# Patient Record
Sex: Male | Born: 2000 | Race: Black or African American | Hispanic: No | Marital: Single | Smoking: Never smoker
Health system: Southern US, Community
[De-identification: ages and names within clinical notes are randomized; demographics above are authoritative.]

---
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
## 2018-01-21 ENCOUNTER — Ambulatory Visit: Payer: BLUE CROSS/BLUE SHIELD | Admitting: Physician Assistant

## 2019-02-08 ENCOUNTER — Ambulatory Visit (INDEPENDENT_AMBULATORY_CARE_PROVIDER_SITE_OTHER): Payer: BC Managed Care – PPO | Admitting: Family Medicine

## 2019-02-08 ENCOUNTER — Other Ambulatory Visit: Payer: Self-pay

## 2019-02-08 ENCOUNTER — Encounter: Payer: Self-pay | Admitting: Family Medicine

## 2019-02-08 VITALS — BP 140/80 | HR 96 | Temp 98.0°F | Resp 16 | Ht 77.0 in | Wt >= 6400 oz

## 2019-02-08 DIAGNOSIS — Z0000 Encounter for general adult medical examination without abnormal findings: Secondary | ICD-10-CM

## 2019-02-08 DIAGNOSIS — Z6841 Body Mass Index (BMI) 40.0 and over, adult: Secondary | ICD-10-CM

## 2019-02-08 NOTE — Progress Notes (Signed)
Subjective:    Patient ID: Jacob Harrington, male    DOB: 02/06/2001, 18 y.o.   MRN: [id number]  HPI Jacob Harrington is a 18 y.o. male Presents today for: Chief Complaint  Patient presents with   Annual Exam    patient not haveing any issus or complaints at this time   In for physical exam.  Was last seen June 2019 by Dr. Pamella Pert for school physical.  At that time was going to school at [employer].  Planned football, playing high school.  Denied prior concussion or injury.   Prior HS - [employer], played [employer] in HS.  Planned walk on last year, but would not play? Considering playing this year, but unknown return plans. In person 1st semester, then virtual?   No known family history of sudden cardiac death or deaths under age 31.   No meds.  No surgeries.   Sexually active - 3 lifetime partners. No hx of STI, no prior testing. Wears condoms every time.  Declines STI testing.    Obesity: Fast food - 2 days per week,  No soda/sweet tea. No alcohol.  Fried food - 2 days per week.  No regular exercise.  Inconsistent breakfast intake.  No new hot or cold intolerance. No new hair or skin changes, heart palpitations or new fatigue.    Screening lipids, glucose recommended - declined blood work today.   Body mass index is 51.92 kg/m.  Wt Readings from Last 3 Encounters:  02/08/19 (!) 437 lb 12.8 oz (198.6 kg)  12/14/17 (!) 420 lb 9.6 oz (190.8 kg) (>99 %, Z= 3.93)*  09/20/17 (!) 407 lb (184.6 kg) (>99 %, Z= 3.83)*   * Growth percentiles are based on CDC (Boys, 2-20 Years) data.     Depression screen [facility] 2/9 02/08/2019 12/14/2017 01/22/2016 03/11/2015  Decreased Interest 0 0 0 0  Down, Depressed, Hopeless 0 0 0 0  PHQ - 2 Score 0 0 0 0  Altered sleeping - - 0 -  Tired, decreased energy - - 0 -  Change in appetite - - 0 -  Feeling bad or failure about yourself  - - 0 -  Trouble concentrating - - 0 -  Moving slowly or fidgety/restless - - 0 -  Suicidal thoughts  - - 0 -  PHQ-9 Score - - 0 -    Immunization History  Administered Date(s) Administered   DTaP 03/05/1999, 05/05/1999, 07/09/1999, 04/13/2000, 01/04/2004   Hepatitis A 03/24/2012, 02/07/2014   Hepatitis B [birth year]Jul-1998, 01/29/1999, 07/09/1999   HiB (PRP-OMP) 03/05/1999, 05/05/1999, 07/09/1999, 04/13/2000   IPV 03/05/1999, 05/05/1999, 01/12/2000, 01/04/2004   Influenza-Unspecified 02/24/2008, 03/24/2012   MMR 01/12/2000, 01/04/2004   Meningococcal Conjugate 01/02/2010   Meningococcal Polysaccharide 01/24/2015   Pneumococcal Conjugate-13 11/20/1999, 01/12/2000   Pneumococcal-Unspecified 10/13/1999   Td 01/02/2010   Tdap 01/02/2010   Varicella 01/12/2000, 02/07/2014  not sure about HPV vaccine - would like to read about it first.   No exam data present   Dentist- every 6 months.    No alcohol/tobacco.    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 No past surgical history on file. No Known Allergies Prior to Admission medications   Not on File   Social History   Socioeconomic History   Marital status: Single    Spouse name: Not on file   Number of children: Not on file   Years  of education: Not on file   Highest education level: Not on file  Occupational History   Not on file  Social Needs   Financial resource strain: Not on file   Food insecurity    Worry: Not on file    Inability: Not on file   Transportation needs    Medical: Not on file    Non-medical: Not on file  Tobacco Use   Smoking status: Never Smoker   Smokeless tobacco: Never Used  Substance and Sexual Activity   Alcohol use: No   Drug use: No   Sexual activity: Yes  Lifestyle   Physical activity    Days per week: Not on file    Minutes per session: Not on file   Stress: Not on file  Relationships   Social connections    Talks on phone: Not on file    Gets together: Not on file     Attends religious service: Not on file    Active member of club or organization: Not on file    Attends meetings of clubs or organizations: Not on file    Relationship status: Not on file   Intimate partner violence    Fear of current or ex partner: Not on file    Emotionally abused: Not on file    Physically abused: Not on file    Forced sexual activity: Not on file  Other Topics Concern   Not on file  Social History Narrative   Not on file    Review of Systems 13 point review of systems per patient health survey noted.  Negative other than as indicated above or in HPI.      Objective:   Physical Exam Constitutional:      Appearance: He is well-developed. He is obese.  HENT:     Head: Normocephalic and atraumatic.     Right Ear: External ear normal.     Left Ear: External ear normal.  Eyes:     Conjunctiva/sclera: Conjunctivae normal.     Pupils: Pupils are equal, round, and reactive to light.  Neck:     Musculoskeletal: Normal range of motion and neck supple.     Thyroid: No thyromegaly.  Cardiovascular:     Rate and Rhythm: Normal rate and regular rhythm.     Heart sounds: Normal heart sounds.  Pulmonary:     Effort: Pulmonary effort is normal. No respiratory distress.     Breath sounds: Normal breath sounds. No wheezing.  Abdominal:     General: There is no distension.     Palpations: Abdomen is soft.     Tenderness: There is no abdominal tenderness.  Musculoskeletal: Normal range of motion.        General: No tenderness.     Right shoulder: Normal.     Left shoulder: Normal.     Right elbow: Normal.    Left elbow: Normal.     Right wrist: Normal.     Left wrist: Normal.     Right hip: Normal.     Left hip: Normal.     Right knee: Normal.     Left knee: Normal.     Right ankle: Normal.     Left ankle: Normal.     Lumbar back: Normal.  Lymphadenopathy:     Cervical: No cervical adenopathy.  Skin:    General: Skin is warm and dry.  Neurological:      Mental Status: He is alert and oriented to person, place, and  time.  Psychiatric:        Behavior: Behavior normal.    Vitals:   02/08/19 1505  BP: 140/80  Pulse: 96  Resp: 16  Temp: 98 F (36.7 C)  TempSrc: Oral  SpO2: 98%  Weight: (!) 437 lb 12.8 oz (198.6 kg)  Height: _0  (1.956 m)       Assessment & Plan:  Jacob Harrington is a 18 y.o. male Annual physical exam  - -anticipatory guidance as below in AVS, screening labs above. Health maintenance items as above in HPI discussed/recommended as applicable.   -HPV vaccination discussed with information provided.  He will read on that first, declined at present.  BMI 50.0-59.9, adult ([place])  -Unfortunately has had 30 pound weight gain since April 2019.  -Initial approach of breakfast each day, increase activity with low intensity exercise most days of the week, avoidance of late night meals discussed.  -Plan on follow-up in the next 4 months and can discuss other approaches at that time and recommendations.  -Diabetic/hyperlipidemia screening discussed but this time.  Would recommend having that at next visit or sooner if he would like.    No orders of the defined types were placed in this encounter.  Patient Instructions   HPV vaccine recommended if you have not had that series.  [email or web address]  Try to eat a healthy breakfast each day, decrease portion sizes at dinner and try avoid late night meals.  Continue to avoid sugar containing beverages like soda and sweet tea.  Avoid fast food and fried food as much as possible.  Try to incorporate exercise most days per week. Walking or low intensity exercise to start, and slowly increase intensity as tolerated..   Follow up in 4 months to recheck and can recheck your blood pressure at that time as well as some screening blood tests for diabetes, cholesterol and possibly thyroid testing.   Keeping you healthy  Get these tests  Blood pressure-  Have your blood pressure checked once a year by your healthcare provider.  Normal blood pressure is 120/80.  Weight- Have your body mass index (BMI) calculated to screen for obesity.  BMI is a measure of body fat based on height and weight. You can also calculate your own BMI at GravelBags.[email or web address].  Cholesterol- Have your cholesterol checked regularly starting at age 6, sooner may be necessary if you have diabetes, high blood pressure, if a family member developed heart diseases at an early age or if you smoke.   Chlamydia, HIV, and other sexual transmitted disease- Get screened each year until the age of 43 then within three months of each new sexual partner.  Diabetes- Have your blood sugar checked regularly if you have high blood pressure, high cholesterol, a family history of diabetes or if you are overweight.  Get these vaccines  Flu shot- Every fall.  Tetanus shot- Every 10 years.  Menactra- Single dose; prevents meningitis.  Take these steps  Don't smoke- If you do smoke, ask your healthcare provider about quitting. For tips on how to quit, go to www.smokefree.gov or call 1-800-QUIT-NOW.  Be physically active- Exercise 5 days a week for at least 30 minutes.  If you are not already physically active start slow and gradually work up to 30 minutes of moderate physical activity.  Examples of moderate activity include walking briskly, mowing the yard, dancing, swimming bicycling, etc.  Eat a healthy diet- Eat a variety of healthy foods such as fruits, vegetables, low fat milk, low  fat cheese, yogurt, lean meats, poultry, fish, beans, tofu, etc.  For more information on healthy eating, go to www.thenutritionsource.org  Drink alcohol in moderation- Limit alcohol intake two drinks or less a day.  Never drink and drive.  Dentist- Brush and floss teeth twice daily; visit your dentis twice a year.  Depression-Your emotional health is as important as your physical health.  If you're  feeling down, losing interest in things you normally enjoy please talk with your healthcare provider.  Gun Safety- If you keep a gun in your home, keep it unloaded and with the safety lock on.  Bullets should be stored separately.  Helmet use- Always wear a helmet when riding a motorcycle, bicycle, rollerblading or skateboarding.  Safe sex- If you may be exposed to a sexually transmitted infection, use a condom  Seat belts- Seat bels can save your life; always wear one.  Smoke/Carbon Monoxide detectors- These detectors need to be installed on the appropriate level of your home.  Replace batteries at least once a year.  Skin Cancer- When out in the sun, cover up and use sunscreen SPF 15 or higher.  Violence- If anyone is threatening or hurting you, please tell your healthcare provider.  If you have lab work done today you will be contacted with your lab results within the next 2 weeks.  If you have not heard from [place] then please contact us. The fastest way to get your results is to register for My Chart.   IF you received an x-ray today, you will receive an invoice from [facility]. Please contact [facility] at [phone] with questions or concerns regarding your invoice.   IF you received labwork today, you will receive an invoice from [place]. Please contact [facility] at [phone] with questions or concerns regarding your invoice.   Our billing staff will not be able to assist you with questions regarding bills from these companies.  You will be contacted with the lab results as soon as they are available. The fastest way to get your results is to activate your My Chart account. Instructions are located on the last page of this paperwork. If you have not heard from [place] regarding the results in 2 weeks, please contact this office.       Signed,   Merri Ray, MD [facility] [place].  02/08/19 8:07 PM

## 2019-02-08 NOTE — Patient Instructions (Addendum)
HPV vaccine recommended if you have not had that series.  [email or web address]  Try to eat a healthy breakfast each day, decrease portion sizes at dinner and try avoid late night meals.  Continue to avoid sugar containing beverages like soda and sweet tea.  Avoid fast food and fried food as much as possible.  Try to incorporate exercise most days per week. Walking or low intensity exercise to start, and slowly increase intensity as tolerated..   Follow up in 4 months to recheck and can recheck your blood pressure at that time as well as some screening blood tests for diabetes, cholesterol and possibly thyroid testing.   Keeping you healthy  Get these tests  Blood pressure- Have your blood pressure checked once a year by your healthcare provider.  Normal blood pressure is 120/80.  Weight- Have your body mass index (BMI) calculated to screen for obesity.  BMI is a measure of body fat based on height and weight. You can also calculate your own BMI at GravelBags.it.  Cholesterol- Have your cholesterol checked regularly starting at age 1, sooner may be necessary if you have diabetes, high blood pressure, if a family member developed heart diseases at an early age or if you smoke.   Chlamydia, HIV, and other sexual transmitted disease- Get screened each year until the age of 69 then within three months of each new sexual partner.  Diabetes- Have your blood sugar checked regularly if you have high blood pressure, high cholesterol, a family history of diabetes or if you are overweight.  Get these vaccines  Flu shot- Every fall.  Tetanus shot- Every 10 years.  Menactra- Single dose; prevents meningitis.  Take these steps  Don't smoke- If you do smoke, ask your healthcare provider about quitting. For tips on how to quit, go to www.smokefree.gov or call 1-800-QUIT-NOW.  Be physically active- Exercise 5 days a week for at least 30 minutes.  If you are  not already physically active start slow and gradually work up to 30 minutes of moderate physical activity.  Examples of moderate activity include walking briskly, mowing the yard, dancing, swimming bicycling, etc.  Eat a healthy diet- Eat a variety of healthy foods such as fruits, vegetables, low fat milk, low fat cheese, yogurt, lean meats, poultry, fish, beans, tofu, etc.  For more information on healthy eating, go to www.thenutritionsource.org  Drink alcohol in moderation- Limit alcohol intake two drinks or less a day.  Never drink and drive.  Dentist- Brush and floss teeth twice daily; visit your dentis twice a year.  Depression-Your emotional health is as important as your physical health.  If you're feeling down, losing interest in things you normally enjoy please talk with your healthcare provider.  Gun Safety- If you keep a gun in your home, keep it unloaded and with the safety lock on.  Bullets should be stored separately.  Helmet use- Always wear a helmet when riding a motorcycle, bicycle, rollerblading or skateboarding.  Safe sex- If you may be exposed to a sexually transmitted infection, use a condom  Seat belts- Seat bels can save your life; always wear one.  Smoke/Carbon Monoxide detectors- These detectors need to be installed on the appropriate level of your home.  Replace batteries at least once a year.  Skin Cancer- When out in the sun, cover up and use sunscreen SPF 15 or higher.  Violence- If anyone is threatening or hurting you, please tell your healthcare provider.  If you have lab work done today you  will be contacted with your lab results within the next 2 weeks.  If you have not heard from us then please contact us. The fastest way to get your results is to register for My Chart.   IF you received an x-ray today, you will receive an invoice from [facility]. Please contact [facility] at [phone] with questions or concerns regarding your  invoice.   IF you received labwork today, you will receive an invoice from [facility]. Please contact [facility] at [phone] with questions or concerns regarding your invoice.   Our billing staff will not be able to assist you with questions regarding bills from these companies.  You will be contacted with the lab results as soon as they are available. The fastest way to get your results is to activate your My Chart account. Instructions are located on the last page of this paperwork. If you have not heard from us regarding the results in 2 weeks, please contact this office.

## 2019-05-29 DIAGNOSIS — Z9189 Other specified personal risk factors, not elsewhere classified: Secondary | ICD-10-CM | POA: Diagnosis not present

## 2019-05-29 DIAGNOSIS — Z20828 Contact with and (suspected) exposure to other viral communicable diseases: Secondary | ICD-10-CM | POA: Diagnosis not present

## 2019-06-02 ENCOUNTER — Ambulatory Visit: Payer: BC Managed Care – PPO | Admitting: Family Medicine

## 2019-06-05 ENCOUNTER — Ambulatory Visit: Payer: BC Managed Care – PPO | Admitting: Family Medicine

## 2019-07-02 DIAGNOSIS — Z20828 Contact with and (suspected) exposure to other viral communicable diseases: Secondary | ICD-10-CM | POA: Diagnosis not present

## 2022-03-13 ENCOUNTER — Encounter (HOSPITAL_COMMUNITY): Payer: Self-pay

## 2022-03-13 ENCOUNTER — Ambulatory Visit (HOSPITAL_COMMUNITY)
Admission: EM | Admit: 2022-03-13 | Discharge: 2022-03-13 | Disposition: A | Discharge status: Home / Self Care | Payer: Self-pay | Attending: Emergency Medicine | Admitting: Emergency Medicine

## 2022-03-13 DIAGNOSIS — K529 Noninfective gastroenteritis and colitis, unspecified: Secondary | ICD-10-CM

## 2022-03-13 DIAGNOSIS — J029 Acute pharyngitis, unspecified: Secondary | ICD-10-CM

## 2022-03-13 LAB — POC INFLUENZA A AND B ANTIGEN (URGENT CARE ONLY)
INFLUENZA A ANTIGEN, POC: NEGATIVE
INFLUENZA B ANTIGEN, POC: NEGATIVE

## 2022-03-13 LAB — POCT RAPID STREP A, ED / UC: Streptococcus, Group A Screen (Direct): NEGATIVE

## 2022-03-13 MED ORDER — ONDANSETRON 4 MG PO TBDP: 4.0000 mg | ORAL_TABLET | Freq: Four times a day (QID) | ORAL | 0 refills | Status: AC | PRN

## 2022-03-13 MED ORDER — LIDOCAINE VISCOUS HCL 2 % MT SOLN: 15.0000 mL | OROMUCOSAL | 0 refills | Status: AC | PRN

## 2022-03-13 MED ORDER — ACETAMINOPHEN 325 MG PO TABS
ORAL_TABLET | ORAL | Status: AC
  Filled 2022-03-13: qty 2

## 2022-03-13 MED ORDER — ONDANSETRON 4 MG PO TBDP
ORAL_TABLET | ORAL | Status: AC
  Filled 2022-03-13: qty 1

## 2022-03-13 MED ORDER — ACETAMINOPHEN 325 MG PO TABS
650.0000 mg | ORAL_TABLET | Freq: Once | ORAL | Status: AC
  Administered 2022-03-13: 650 mg via ORAL

## 2022-03-13 MED ORDER — ONDANSETRON 4 MG PO TBDP
4.0000 mg | ORAL_TABLET | Freq: Once | ORAL | Status: AC
  Administered 2022-03-13: 4 mg via ORAL

## 2022-03-13 NOTE — Discharge Instructions (Addendum)
You can use the nausea medicine, dissolved under the tongue, every 6 hours as needed.  This should help settle your stomach. Drink lots of fluids! If you have appetite, I recommend starting with a bland diet and working your way back to normal.  For fever and sore throat you can use Tylenol every 4-6 hours.  You can try the viscous lidocaine gargle and spit as needed for sore throat.  You can return to urgent care if symptoms persist despite symptomatic care.  If anything worsens, please go to the emergency department.

## 2022-03-13 NOTE — ED Triage Notes (Signed)
Pt states sore throat, vomiting for the past 2 days.

## 2022-03-13 NOTE — ED Provider Notes (Signed)
[place]    CSN: [id number] Arrival date & time: 03/13/22  1007      History   Chief Complaint Chief Complaint  Patient presents with   Sore Throat    HPI Demar A Rosenbloom is a 21 y.o. male.  Presents with 4-day history of sore throat 7/10 pain with swallowing Reports vomiting x2 days Nausea, decreased appetite Tolerates po fluids  Denies fever, but feels hot and sweating on and off, some chills No nasal congestion or cough Denies abdominal pain, diarrhea.  Home test covid negative today No known sick contacts  No medications PTA  History reviewed. No pertinent past medical history.  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 No pertinent surgical history.   Home Medications    Prior to Admission medications   Medication Sig Start Date End Date Taking? Authorizing Provider  lidocaine (XYLOCAINE) 2 % solution Use as directed 15 mLs in the mouth or throat as needed for mouth pain. 03/13/22  Yes Scorpio Fortin, Wells Guiles, PA-C  ondansetron (ZOFRAN-ODT) 4 MG disintegrating tablet Take 1 tablet (4 mg total) by mouth every 6 (six) hours as needed for nausea or vomiting. 03/13/22  Yes Denay Pleitez, Wells Guiles, PA-C    Family History Family History  Problem Relation Age of Onset   Healthy Mother    Healthy Father    Healthy Sister    Healthy Maternal Grandmother    Healthy Maternal Grandfather    Healthy Paternal Grandmother    Healthy Paternal Grandfather     Social History Social History   Tobacco Use   Smoking status: Never   Smokeless tobacco: Never  Substance Use Topics   Alcohol use: No   Drug use: No     Allergies   Patient has no known allergies.   Review of Systems Review of Systems Per HPI  Physical Exam Triage Vital Signs ED Triage Vitals  Enc Vitals Group     BP 03/13/22 1142 (!) 149/119     Pulse Rate 03/13/22 1142 99     Resp 03/13/22 1142 16     Temp 03/13/22 1139  99.8 F (37.7 C)     Temp Source 03/13/22 1139 Oral     SpO2 03/13/22 1142 99 %     Weight --      Height --      Head Circumference --      Peak Flow --      Pain Score 03/13/22 1140 7     Pain Loc --      Pain Edu? --      Excl. in [place]? --    No data found.  Updated Vital Signs BP (!) 143/110   Pulse 98   Temp 100.3 F (37.9 C) (Oral)   Resp 16   SpO2 98%    Physical Exam Vitals and nursing note reviewed.  Constitutional:      General: He is not in acute distress.    Appearance: He is diaphoretic.     Comments: Patient laying on floor in exam room because it feels cool  HENT:     Mouth/Throat:     Mouth: Mucous membranes are moist.     Pharynx: Oropharynx is clear. Uvula midline. Posterior oropharyngeal erythema present. No uvula swelling.     Tonsils: Tonsillar exudate present. No tonsillar abscesses. 2+ on the right. 2+ on the left.  Eyes:     Conjunctiva/sclera: Conjunctivae normal.  Cardiovascular:     Rate and Rhythm: Normal rate and regular rhythm.     Pulses: Normal pulses.     Heart sounds: Normal heart sounds.  Pulmonary:     Effort: Pulmonary effort is normal.     Breath sounds: Normal breath sounds.  Abdominal:     General: Abdomen is protuberant.     Tenderness: There is no abdominal tenderness. There is no guarding.  Lymphadenopathy:     Cervical: No cervical adenopathy.  Skin:    General: Skin is warm and moist.     Comments: Diaphoretic   Neurological:     Mental Status: He is alert and oriented to person, place, and time.     UC Treatments / Results  Labs (all labs ordered are listed, but only abnormal results are displayed) Labs Reviewed  POCT RAPID STREP A, ED / UC  POC INFLUENZA A AND B ANTIGEN (URGENT CARE ONLY)    EKG  Radiology No results found.  Procedures Procedures   Medications Ordered in UC Medications  ondansetron (ZOFRAN-ODT) disintegrating tablet 4 mg (4 mg Oral Given 03/13/22 1308)  acetaminophen (TYLENOL)  tablet 650 mg (650 mg Oral Given 03/13/22 1337)    Initial Impression / Assessment and Plan / UC Course  I have reviewed the triage vital signs and the nursing notes.  Pertinent labs & imaging results that were available during my care of the patient were reviewed by me and considered in my medical decision making (see chart for details).  Strep and flu tests negative Likely viral etiology of symptoms  Zofran ODT given for nausea Tylenol for sore throat and increasing temp (100.3) Patient reports stomach much better, tolerating po fluids in clinic Maybe a little improvement in sore throat  Sent zofran to use PRN, recommend tylenol, lots of fluids, bland diet Can try the viscous lidocaine  Work note provided Return precautions discussed. Patient agrees to plan  Final Clinical Impressions(s) / UC Diagnoses   Final diagnoses:  Viral pharyngitis  Gastroenteritis     Discharge Instructions      You can use the nausea medicine, dissolved under the tongue, every 6 hours as needed.  This should help settle your stomach. Drink lots of fluids! If you have appetite, I recommend starting with a bland diet and working your way back to normal.  For fever and sore throat you can use Tylenol every 4-6 hours.  You can try the viscous lidocaine gargle and spit as needed for sore throat.  You can return to urgent care if symptoms persist despite symptomatic care.  If anything worsens, please go to the emergency department.     ED Prescriptions     Medication Sig Dispense Auth. Provider   ondansetron (ZOFRAN-ODT) 4 MG disintegrating tablet Take 1 tablet (4 mg total) by mouth every 6 (six) hours as needed for nausea or vomiting. 20 tablet Asim Gersten, PA-C   lidocaine (XYLOCAINE) 2 % solution Use as directed 15 mLs in the mouth or throat as needed for mouth pain. 100 mL Fares Ramthun, Wells Guiles, PA-C      PDMP not reviewed this encounter.   Kimmberly Wisser, Vernice Jefferson 03/13/22 1435
